# Patient Record
Sex: Female | Born: 1965 | Race: White | Hispanic: No | Smoking: Current every day smoker
Health system: Southern US, Community
[De-identification: ages and names within clinical notes are randomized; demographics above are authoritative.]

## PROBLEM LIST (undated history)

## (undated) ENCOUNTER — Inpatient Hospital Stay (HOSPITAL_COMMUNITY): Payer: Medicaid Other

## (undated) DIAGNOSIS — F319 Bipolar disorder, unspecified: Secondary | ICD-10-CM

## (undated) DIAGNOSIS — E119 Type 2 diabetes mellitus without complications: Secondary | ICD-10-CM

## (undated) DIAGNOSIS — I10 Essential (primary) hypertension: Secondary | ICD-10-CM

## (undated) DIAGNOSIS — J449 Chronic obstructive pulmonary disease, unspecified: Secondary | ICD-10-CM

## (undated) DIAGNOSIS — M1990 Unspecified osteoarthritis, unspecified site: Secondary | ICD-10-CM

## (undated) DIAGNOSIS — K219 Gastro-esophageal reflux disease without esophagitis: Secondary | ICD-10-CM

## (undated) HISTORY — PX: KNEE ARTHROSCOPY: SUR90

## (undated) HISTORY — PX: CHOLECYSTECTOMY: SHX55

## (undated) HISTORY — PX: OTHER SURGICAL HISTORY: SHX169

## (undated) HISTORY — PX: ABDOMINAL HYSTERECTOMY: SHX81

## (undated) HISTORY — PX: ELBOW SURGERY: SHX618

## (undated) HISTORY — PX: TUBAL LIGATION: SHX77

---

## 2010-05-30 ENCOUNTER — Emergency Department (HOSPITAL_BASED_OUTPATIENT_CLINIC_OR_DEPARTMENT_OTHER)
Admission: EM | Admit: 2010-05-30 | Discharge: 2010-05-31 | Disposition: A | Discharge status: Home / Self Care | Payer: Medicaid Other | Attending: Emergency Medicine | Admitting: Emergency Medicine

## 2010-05-30 ENCOUNTER — Emergency Department (INDEPENDENT_AMBULATORY_CARE_PROVIDER_SITE_OTHER): Payer: Medicaid Other

## 2010-05-30 DIAGNOSIS — R05 Cough: Secondary | ICD-10-CM

## 2010-05-30 DIAGNOSIS — R0602 Shortness of breath: Secondary | ICD-10-CM | POA: Insufficient documentation

## 2010-05-30 DIAGNOSIS — F17200 Nicotine dependence, unspecified, uncomplicated: Secondary | ICD-10-CM | POA: Insufficient documentation

## 2010-05-30 DIAGNOSIS — J40 Bronchitis, not specified as acute or chronic: Secondary | ICD-10-CM | POA: Insufficient documentation

## 2010-05-30 DIAGNOSIS — I10 Essential (primary) hypertension: Secondary | ICD-10-CM | POA: Insufficient documentation

## 2010-05-30 DIAGNOSIS — R059 Cough, unspecified: Secondary | ICD-10-CM | POA: Insufficient documentation

## 2010-06-14 ENCOUNTER — Emergency Department (INDEPENDENT_AMBULATORY_CARE_PROVIDER_SITE_OTHER): Payer: Medicaid Other

## 2010-06-14 ENCOUNTER — Emergency Department (HOSPITAL_BASED_OUTPATIENT_CLINIC_OR_DEPARTMENT_OTHER)
Admission: EM | Admit: 2010-06-14 | Discharge: 2010-06-14 | Disposition: A | Discharge status: Home / Self Care | Payer: Medicaid Other | Attending: Emergency Medicine | Admitting: Emergency Medicine

## 2010-06-14 DIAGNOSIS — R059 Cough, unspecified: Secondary | ICD-10-CM | POA: Insufficient documentation

## 2010-06-14 DIAGNOSIS — R05 Cough: Secondary | ICD-10-CM

## 2010-06-14 DIAGNOSIS — I10 Essential (primary) hypertension: Secondary | ICD-10-CM | POA: Insufficient documentation

## 2010-06-14 DIAGNOSIS — F1010 Alcohol abuse, uncomplicated: Secondary | ICD-10-CM | POA: Insufficient documentation

## 2010-06-14 DIAGNOSIS — R490 Dysphonia: Secondary | ICD-10-CM | POA: Insufficient documentation

## 2010-06-14 DIAGNOSIS — R093 Abnormal sputum: Secondary | ICD-10-CM | POA: Insufficient documentation

## 2010-06-14 DIAGNOSIS — J40 Bronchitis, not specified as acute or chronic: Secondary | ICD-10-CM | POA: Insufficient documentation

## 2010-06-14 DIAGNOSIS — Z79899 Other long term (current) drug therapy: Secondary | ICD-10-CM | POA: Insufficient documentation

## 2012-01-11 ENCOUNTER — Emergency Department (HOSPITAL_BASED_OUTPATIENT_CLINIC_OR_DEPARTMENT_OTHER): Payer: Medicaid Other

## 2012-01-11 ENCOUNTER — Encounter (HOSPITAL_BASED_OUTPATIENT_CLINIC_OR_DEPARTMENT_OTHER): Payer: Self-pay | Admitting: *Deleted

## 2012-01-11 ENCOUNTER — Emergency Department (HOSPITAL_BASED_OUTPATIENT_CLINIC_OR_DEPARTMENT_OTHER)
Admission: EM | Admit: 2012-01-11 | Discharge: 2012-01-11 | Disposition: A | Discharge status: Home / Self Care | Payer: Medicaid Other | Attending: Emergency Medicine | Admitting: Emergency Medicine

## 2012-01-11 DIAGNOSIS — F17200 Nicotine dependence, unspecified, uncomplicated: Secondary | ICD-10-CM | POA: Insufficient documentation

## 2012-01-11 DIAGNOSIS — Z79899 Other long term (current) drug therapy: Secondary | ICD-10-CM | POA: Insufficient documentation

## 2012-01-11 DIAGNOSIS — J40 Bronchitis, not specified as acute or chronic: Secondary | ICD-10-CM | POA: Insufficient documentation

## 2012-01-11 DIAGNOSIS — I10 Essential (primary) hypertension: Secondary | ICD-10-CM | POA: Insufficient documentation

## 2012-01-11 HISTORY — DX: Unspecified osteoarthritis, unspecified site: M19.90

## 2012-01-11 HISTORY — DX: Essential (primary) hypertension: I10

## 2012-01-11 MED ORDER — IPRATROPIUM BROMIDE 0.02 % IN SOLN
0.5000 mg | Freq: Once | RESPIRATORY_TRACT | Status: AC
  Administered 2012-01-11: 0.5 mg via RESPIRATORY_TRACT
  Filled 2012-01-11: qty 2.5

## 2012-01-11 MED ORDER — ALBUTEROL SULFATE (5 MG/ML) 0.5% IN NEBU
5.0000 mg | INHALATION_SOLUTION | Freq: Once | RESPIRATORY_TRACT | Status: AC
  Administered 2012-01-11: 5 mg via RESPIRATORY_TRACT
  Filled 2012-01-11: qty 1

## 2012-01-11 MED ORDER — ALBUTEROL SULFATE HFA 108 (90 BASE) MCG/ACT IN AERS: 1.0000 | INHALATION_SPRAY | Freq: Four times a day (QID) | RESPIRATORY_TRACT | Status: DC | PRN

## 2012-01-11 MED ORDER — AZITHROMYCIN 250 MG PO TABS: ORAL_TABLET | ORAL | Status: DC

## 2012-01-11 NOTE — ED Provider Notes (Signed)
History     CSN: [id number]  Arrival date & time 01/11/12  6578   First MD Initiated Contact with Patient 01/11/12 2102      Chief Complaint  Patient presents with  . Cough    (Consider location/radiation/quality/duration/timing/severity/associated sxs/prior treatment) Patient is a 46 y.o. female presenting with cough. The history is provided by the patient. No language interpreter was used.  Cough This is a new problem. Episode onset: 2 months. The problem occurs constantly. The problem has been gradually worsening. The cough is productive of sputum. There has been no fever. She has tried nothing for the symptoms. The treatment provided no relief. She is a smoker.  Pt reports she has not been taking her blood pressure medications  Past Medical History  Diagnosis Date  . Hypertension   . Arthritis     Past Surgical History  Procedure Date  . Elbow surgery     No family history on file.  History  Substance Use Topics  . Smoking status: Current Every Day Smoker -- 1.0 packs/day    Types: Cigarettes  . Smokeless tobacco: Not on file  . Alcohol Use: No    OB History    Grav Para Term Preterm Abortions TAB SAB Ect Mult Living                  Review of Systems  Respiratory: Positive for cough.   All other systems reviewed and are negative.    Allergies  Review of patient's allergies indicates no known allergies.  Home Medications   Current Outpatient Rx  Name Route Sig Dispense Refill  . ENALAPRIL MALEATE PO Oral Take by mouth.    Marland Kitchen HYDROCHLOROTHIAZIDE 12.5 MG PO CAPS Oral Take 12.5 mg by mouth daily.      BP 136/115  Pulse 68  Temp 98.3 F (36.8 C) (Oral)  Resp 16  SpO2 98%  Physical Exam  Nursing note and vitals reviewed. Constitutional: She is oriented to person, place, and time. She appears well-developed and well-nourished.  HENT:  Head: Normocephalic and atraumatic.  Right Ear: External ear normal.  Left Ear: External ear normal.  Nose:  Nose normal.  Mouth/Throat: Oropharynx is clear and moist.  Eyes: Conjunctivae normal are normal. Pupils are equal, round, and reactive to light.  Neck: Normal range of motion. Neck supple.  Cardiovascular: Normal rate and normal heart sounds.   Pulmonary/Chest: Effort normal and breath sounds normal.  Abdominal: Soft.  Musculoskeletal: Normal range of motion.  Neurological: She is oriented to person, place, and time.  Skin: Skin is warm.  Psychiatric: She has a normal mood and affect.    ED Course  Procedures (including critical care time)  Labs Reviewed - No data to display Dg Chest 2 View  01/11/2012  *RADIOLOGY REPORT*  Clinical Data: Productive cough.  Chest pain and congestion. Smoker.  CHEST - 2 VIEW  Comparison: 06/14/2010.  Findings: The heart remains normal in size and the lungs are clear. A small nodular density at the left lung base is slightly smaller than seen on 05/30/2010 and remains compatible with a small calcified granuloma.  This is in the lingula on the lateral view. Mild thoracic spine degenerative changes.  IMPRESSION: No acute abnormality.   Original Report Authenticated By: Darrol Angel, M.D.      1. Bronchitis   2. Hypertension       MDM  Pt feels better after albuterol neb,  I advised pt to take her blood pressure medications  and I will treat with zithromax and albuterol        Lonia Skinner [place], [place] 01/11/12 2217

## 2012-01-11 NOTE — ED Provider Notes (Signed)
Medical screening examination/treatment/procedure(s) were performed by non-physician practitioner and as supervising physician I was immediately available for consultation/collaboration.  Ethelda Chick, MD 01/11/12 2218

## 2012-01-11 NOTE — ED Notes (Signed)
Cough x 2 months. Coughs so hard it makes her vomit.

## 2012-02-07 ENCOUNTER — Emergency Department (HOSPITAL_BASED_OUTPATIENT_CLINIC_OR_DEPARTMENT_OTHER)
Admission: EM | Admit: 2012-02-07 | Discharge: 2012-02-07 | Disposition: A | Discharge status: Home / Self Care | Payer: Medicaid Other | Attending: Emergency Medicine | Admitting: Emergency Medicine

## 2012-02-07 ENCOUNTER — Encounter (HOSPITAL_BASED_OUTPATIENT_CLINIC_OR_DEPARTMENT_OTHER): Payer: Self-pay

## 2012-02-07 ENCOUNTER — Emergency Department (HOSPITAL_BASED_OUTPATIENT_CLINIC_OR_DEPARTMENT_OTHER): Payer: Medicaid Other

## 2012-02-07 DIAGNOSIS — F17200 Nicotine dependence, unspecified, uncomplicated: Secondary | ICD-10-CM | POA: Insufficient documentation

## 2012-02-07 DIAGNOSIS — Y9289 Other specified places as the place of occurrence of the external cause: Secondary | ICD-10-CM | POA: Insufficient documentation

## 2012-02-07 DIAGNOSIS — I10 Essential (primary) hypertension: Secondary | ICD-10-CM | POA: Insufficient documentation

## 2012-02-07 DIAGNOSIS — Z79899 Other long term (current) drug therapy: Secondary | ICD-10-CM | POA: Insufficient documentation

## 2012-02-07 DIAGNOSIS — X500XXA Overexertion from strenuous movement or load, initial encounter: Secondary | ICD-10-CM | POA: Insufficient documentation

## 2012-02-07 DIAGNOSIS — S99919A Unspecified injury of unspecified ankle, initial encounter: Secondary | ICD-10-CM | POA: Insufficient documentation

## 2012-02-07 DIAGNOSIS — M129 Arthropathy, unspecified: Secondary | ICD-10-CM | POA: Insufficient documentation

## 2012-02-07 DIAGNOSIS — Y9389 Activity, other specified: Secondary | ICD-10-CM | POA: Insufficient documentation

## 2012-02-07 DIAGNOSIS — S8990XA Unspecified injury of unspecified lower leg, initial encounter: Secondary | ICD-10-CM | POA: Insufficient documentation

## 2012-02-07 MED ORDER — TRAMADOL HCL 50 MG PO TABS
50.0000 mg | ORAL_TABLET | Freq: Once | ORAL | Status: AC
  Administered 2012-02-07: 50 mg via ORAL
  Filled 2012-02-07: qty 1

## 2012-02-07 MED ORDER — HYDROCODONE-ACETAMINOPHEN 5-500 MG PO TABS: 1.0000 | ORAL_TABLET | Freq: Four times a day (QID) | ORAL | Status: DC | PRN

## 2012-02-07 NOTE — ED Provider Notes (Signed)
History     CSN: [id number]  Arrival date & time 02/07/12  0040   First MD Initiated Contact with Patient 02/07/12 0114      Chief Complaint  Patient presents with  . Knee Pain    (Consider location/radiation/quality/duration/timing/severity/associated sxs/prior treatment) Patient is a 46 y.o. female presenting with knee pain. The history is provided by the patient. No language interpreter was used.  Knee Pain This is a new problem. The current episode started 2 days ago. The problem occurs constantly. The problem has been rapidly worsening. Pertinent negatives include no chest pain, no abdominal pain, no headaches and no shortness of breath. Nothing aggravates the symptoms. She has tried nothing for the symptoms. The treatment provided no relief.  Turned to get ketchup out of refrigerator and heard a pop in the left knee and has ongoing pain.    Past Medical History  Diagnosis Date  . Hypertension   . Arthritis     Past Surgical History  Procedure Date  . Elbow surgery     No family history on file.  History  Substance Use Topics  . Smoking status: Current Every Day Smoker -- 1.0 packs/day    Types: Cigarettes  . Smokeless tobacco: Not on file  . Alcohol Use: No    OB History    Grav Para Term Preterm Abortions TAB SAB Ect Mult Living                  Review of Systems  Respiratory: Negative for shortness of breath.   Cardiovascular: Negative for chest pain and leg swelling.  Gastrointestinal: Negative for abdominal pain.  Neurological: Negative for headaches.  All other systems reviewed and are negative.    Allergies  Review of patient's allergies indicates no known allergies.  Home Medications   Current Outpatient Rx  Name Route Sig Dispense Refill  . FLUTICASONE-SALMETEROL 230-21 MCG/ACT IN AERO Inhalation Inhale 2 puffs into the lungs 2 (two) times daily.    Marland Kitchen ZIPRASIDONE HCL 20 MG PO CAPS Oral Take 20 mg by mouth 2 (two) times daily with a meal.     . ALBUTEROL SULFATE HFA 108 (90 BASE) MCG/ACT IN AERS Inhalation Inhale 1-2 puffs into the lungs every 6 (six) hours as needed for wheezing. 1 Inhaler 0  . AZITHROMYCIN 250 MG PO TABS  2 tablets first day then 1 tablet days 2-5 6 tablet 0  . ENALAPRIL MALEATE PO Oral Take by mouth.    Marland Kitchen HYDROCHLOROTHIAZIDE 12.5 MG PO CAPS Oral Take 12.5 mg by mouth daily.      BP 126/84  Pulse 80  Temp 99 F (37.2 C) (Oral)  Resp 16  SpO2 100%  Physical Exam  Constitutional: She is oriented to person, place, and time. She appears well-developed and well-nourished. No distress.  HENT:  Head: Normocephalic and atraumatic.  Mouth/Throat: Oropharynx is clear and moist.  Eyes: Conjunctivae normal are normal. Pupils are equal, round, and reactive to light.  Neck: Normal range of motion. Neck supple.  Cardiovascular: Normal rate and regular rhythm.   Pulmonary/Chest: Effort normal and breath sounds normal. She has no wheezes.  Abdominal: Soft. Bowel sounds are normal.  Musculoskeletal: Normal range of motion. She exhibits no edema.       Negative anterior and posterior drawer test of the left knee.  No laxity to varus or valgus stress  Neurological: She is alert and oriented to person, place, and time. She has normal reflexes.  Skin: Skin is warm and  dry.  Psychiatric: She has a normal mood and affect.    ED Course  Procedures (including critical care time)  Labs Reviewed - No data to display Dg Knee Complete 4 Views Left  02/07/2012  *RADIOLOGY REPORT*  Clinical Data: Pain in the left knee after popping 2 days ago.  LEFT KNEE - COMPLETE 4+ VIEW  Comparison: None.  Findings: The left knee appears intact. No evidence of acute fracture or subluxation.  No focal bone lesions.  Bone matrix and cortex appear intact.  No abnormal radiopaque densities in the soft tissues.  No significant effusion.  IMPRESSION: No acute bony abnormalities.   Original Report Authenticated By: Marlon Pel, M.D.       No diagnosis found.    MDM  Will place in immobilizer and treat with pain meds if symptoms persist follow up with orthopedics for ongoing care.  Patient verbalizes understanding and agrees to follow up       Camya Haydon Smitty Cords, MD 02/07/12 [phone]

## 2012-02-07 NOTE — ED Notes (Signed)
Patient reports that she has been having left knee pain x 2 days. Reports that the pain started after she turned to but something in refrigerator. Pain with ambulation

## 2012-08-01 ENCOUNTER — Emergency Department (HOSPITAL_BASED_OUTPATIENT_CLINIC_OR_DEPARTMENT_OTHER): Payer: Medicaid Other

## 2012-08-01 ENCOUNTER — Emergency Department (HOSPITAL_BASED_OUTPATIENT_CLINIC_OR_DEPARTMENT_OTHER)
Admission: EM | Admit: 2012-08-01 | Discharge: 2012-08-01 | Disposition: A | Discharge status: Home / Self Care | Payer: Medicaid Other | Attending: Emergency Medicine | Admitting: Emergency Medicine

## 2012-08-01 ENCOUNTER — Encounter (HOSPITAL_BASED_OUTPATIENT_CLINIC_OR_DEPARTMENT_OTHER): Payer: Self-pay | Admitting: *Deleted

## 2012-08-01 DIAGNOSIS — S99922A Unspecified injury of left foot, initial encounter: Secondary | ICD-10-CM

## 2012-08-01 DIAGNOSIS — Y9389 Activity, other specified: Secondary | ICD-10-CM | POA: Insufficient documentation

## 2012-08-01 DIAGNOSIS — M79675 Pain in left toe(s): Secondary | ICD-10-CM

## 2012-08-01 DIAGNOSIS — S8990XA Unspecified injury of unspecified lower leg, initial encounter: Secondary | ICD-10-CM | POA: Insufficient documentation

## 2012-08-01 DIAGNOSIS — F17200 Nicotine dependence, unspecified, uncomplicated: Secondary | ICD-10-CM | POA: Insufficient documentation

## 2012-08-01 DIAGNOSIS — Z79899 Other long term (current) drug therapy: Secondary | ICD-10-CM | POA: Insufficient documentation

## 2012-08-01 DIAGNOSIS — Y92009 Unspecified place in unspecified non-institutional (private) residence as the place of occurrence of the external cause: Secondary | ICD-10-CM | POA: Insufficient documentation

## 2012-08-01 DIAGNOSIS — IMO0002 Reserved for concepts with insufficient information to code with codable children: Secondary | ICD-10-CM | POA: Insufficient documentation

## 2012-08-01 DIAGNOSIS — S99919A Unspecified injury of unspecified ankle, initial encounter: Secondary | ICD-10-CM | POA: Insufficient documentation

## 2012-08-01 DIAGNOSIS — M129 Arthropathy, unspecified: Secondary | ICD-10-CM | POA: Insufficient documentation

## 2012-08-01 DIAGNOSIS — I10 Essential (primary) hypertension: Secondary | ICD-10-CM | POA: Insufficient documentation

## 2012-08-01 DIAGNOSIS — X58XXXA Exposure to other specified factors, initial encounter: Secondary | ICD-10-CM | POA: Insufficient documentation
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
## 2012-08-02 NOTE — ED Provider Notes (Signed)
Medical screening examination/treatment/procedure(s) were performed by non-physician practitioner and as supervising physician I was immediately available for consultation/collaboration.   Glynn Octave, MD 08/02/12 [phone]

## 2017-06-06 ENCOUNTER — Other Ambulatory Visit: Payer: Self-pay

## 2017-06-06 ENCOUNTER — Encounter (HOSPITAL_BASED_OUTPATIENT_CLINIC_OR_DEPARTMENT_OTHER): Payer: Self-pay | Admitting: Emergency Medicine

## 2017-06-06 ENCOUNTER — Emergency Department (HOSPITAL_BASED_OUTPATIENT_CLINIC_OR_DEPARTMENT_OTHER): Payer: Medicaid Other

## 2017-06-06 ENCOUNTER — Emergency Department (HOSPITAL_BASED_OUTPATIENT_CLINIC_OR_DEPARTMENT_OTHER)
Admission: EM | Admit: 2017-06-06 | Discharge: 2017-06-06 | Disposition: A | Discharge status: Home / Self Care | Payer: Medicaid Other | Attending: Emergency Medicine | Admitting: Emergency Medicine

## 2017-06-06 DIAGNOSIS — J3489 Other specified disorders of nose and nasal sinuses: Secondary | ICD-10-CM | POA: Diagnosis not present

## 2017-06-06 DIAGNOSIS — I10 Essential (primary) hypertension: Secondary | ICD-10-CM | POA: Diagnosis not present

## 2017-06-06 DIAGNOSIS — R0602 Shortness of breath: Secondary | ICD-10-CM | POA: Diagnosis not present

## 2017-06-06 DIAGNOSIS — E119 Type 2 diabetes mellitus without complications: Secondary | ICD-10-CM | POA: Insufficient documentation

## 2017-06-06 DIAGNOSIS — R05 Cough: Secondary | ICD-10-CM | POA: Diagnosis not present

## 2017-06-06 DIAGNOSIS — R059 Cough, unspecified: Secondary | ICD-10-CM

## 2017-06-06 DIAGNOSIS — R1110 Vomiting, unspecified: Secondary | ICD-10-CM | POA: Insufficient documentation

## 2017-06-06 DIAGNOSIS — Z79899 Other long term (current) drug therapy: Secondary | ICD-10-CM | POA: Diagnosis not present

## 2017-06-06 DIAGNOSIS — R062 Wheezing: Secondary | ICD-10-CM | POA: Diagnosis not present

## 2017-06-06 DIAGNOSIS — J449 Chronic obstructive pulmonary disease, unspecified: Secondary | ICD-10-CM | POA: Diagnosis not present

## 2017-06-06 DIAGNOSIS — F17210 Nicotine dependence, cigarettes, uncomplicated: Secondary | ICD-10-CM | POA: Diagnosis not present

## 2017-06-06 HISTORY — DX: Bipolar disorder, unspecified: F31.9

## 2017-06-06 HISTORY — DX: Type 2 diabetes mellitus without complications: E11.9

## 2017-06-06 HISTORY — DX: Gastro-esophageal reflux disease without esophagitis: K21.9

## 2017-06-06 HISTORY — DX: Chronic obstructive pulmonary disease, unspecified: J44.9

## 2017-06-06 LAB — CBC WITH DIFFERENTIAL/PLATELET
BASOS PCT: 0 %
Basophils Absolute: 0 10*3/uL (ref 0.0–0.1)
EOS ABS: 0.1 10*3/uL (ref 0.0–0.7)
Eosinophils Relative: 2 %
HCT: 43.7 % (ref 36.0–46.0)
HEMOGLOBIN: 14.3 g/dL (ref 12.0–15.0)
Lymphocytes Relative: 23 %
Lymphs Abs: 2.2 10*3/uL (ref 0.7–4.0)
MCH: 30.4 pg (ref 26.0–34.0)
MCHC: 32.7 g/dL (ref 30.0–36.0)
MCV: 92.8 fL (ref 78.0–100.0)
MONO ABS: 0.9 10*3/uL (ref 0.1–1.0)
MONOS PCT: 10 %
NEUTROS PCT: 65 %
Neutro Abs: 6.4 10*3/uL (ref 1.7–7.7)
Platelets: 259 10*3/uL (ref 150–400)
RBC: 4.71 MIL/uL (ref 3.87–5.11)
RDW: 15 % (ref 11.5–15.5)
WBC: 9.6 10*3/uL (ref 4.0–10.5)

## 2017-06-06 LAB — BASIC METABOLIC PANEL
Anion gap: 10 (ref 5–15)
BUN: 13 mg/dL (ref 6–20)
CALCIUM: 9.2 mg/dL (ref 8.9–10.3)
CHLORIDE: 100 mmol/L — AB (ref 101–111)
CO2: 29 mmol/L (ref 22–32)
CREATININE: 0.67 mg/dL (ref 0.44–1.00)
GFR calc non Af Amer: >60 mL/min (ref >60)
GLUCOSE: 111 mg/dL — AB (ref 65–99)
Potassium: 3.1 mmol/L — ABNORMAL LOW (ref 3.5–5.1)
Sodium: 139 mmol/L (ref 135–145)

## 2017-06-06 LAB — TROPONIN I

## 2017-06-06 LAB — D-DIMER, QUANTITATIVE (NOT AT ARMC): D DIMER QUANT: 0.38 ug{FEU}/mL (ref 0.00–0.50)

## 2017-06-06 MED ORDER — PREDNISONE 50 MG PO TABS
60.0000 mg | ORAL_TABLET | Freq: Once | ORAL | Status: AC
  Administered 2017-06-06: 60 mg via ORAL
  Filled 2017-06-06: qty 1

## 2017-06-06 MED ORDER — ALBUTEROL SULFATE HFA 108 (90 BASE) MCG/ACT IN AERS: 1.0000 | INHALATION_SPRAY | Freq: Four times a day (QID) | RESPIRATORY_TRACT | 0 refills | Status: AC | PRN

## 2017-06-06 MED ORDER — POTASSIUM CHLORIDE CRYS ER 20 MEQ PO TBCR
40.0000 meq | EXTENDED_RELEASE_TABLET | Freq: Once | ORAL | Status: AC
Start: 2017-06-06 — End: 2017-06-06
  Administered 2017-06-06: 40 meq via ORAL
  Filled 2017-06-06: qty 2

## 2017-06-06 MED ORDER — ACETAMINOPHEN 325 MG PO TABS
650.0000 mg | ORAL_TABLET | Freq: Once | ORAL | Status: AC
  Administered 2017-06-06: 650 mg via ORAL
  Filled 2017-06-06: qty 2

## 2017-06-06 MED ORDER — BENZONATATE 100 MG PO CAPS: 100.0000 mg | ORAL_CAPSULE | Freq: Three times a day (TID) | ORAL | 0 refills | Status: DC

## 2017-06-06 MED ORDER — FENTANYL CITRATE (PF) 100 MCG/2ML IJ SOLN: 25.0000 ug | Freq: Once | INTRAMUSCULAR | Status: DC

## 2017-06-06 MED ORDER — PREDNISONE 20 MG PO TABS: 40.0000 mg | ORAL_TABLET | Freq: Every day | ORAL | 0 refills | Status: DC

## 2017-06-06 MED ORDER — IPRATROPIUM-ALBUTEROL 0.5-2.5 (3) MG/3ML IN SOLN
3.0000 mL | Freq: Once | RESPIRATORY_TRACT | Status: AC
  Administered 2017-06-06: 3 mL via RESPIRATORY_TRACT
  Filled 2017-06-06: qty 3

## 2017-06-06 NOTE — ED Triage Notes (Signed)
Cough for "months". States she has been on several medications without relief.
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
## 2017-06-06 NOTE — ED Notes (Signed)
Alert, NAD, calm, interactive, resps e/u, speaking in clear complete sentences, no dyspnea noted, skin W&D, VSS, c/o cough and HA, (denies: other pain, sob, nausea, dizziness or visual changes). [facility]

## 2017-06-06 NOTE — Discharge Instructions (Signed)
Your lab work has been reassuring.  Your symptoms seem consistent with a chronic bronchitis.  He may also be related to your lisinopril use.  Use the albuterol inhaler as needed for wheezing.  Take the prednisone starting tomorrow for the next 4 days.  Take the Tessalon for cough.  Continued use over-the-counter Mucinex.  Follow-up with your primary care doctor to see they want to discontinue lisinopril and change to a different blood pressure medication.  Return to the ED with any worsening symptoms.

## 2017-06-07 NOTE — ED Provider Notes (Signed)
[facility] EMERGENCY DEPARTMENT Provider Note   CSN: [id number] Arrival date & time: 06/06/17  1512     History   Chief Complaint Chief Complaint  Patient presents with  . Cough    HPI Audrey Woods is a 52 y.o. female.  HPI 52 year old Caucasian female past medical history significant for diabetes, hypertension, bipolar disorder, GERD presents to the emergency department today with complaints of 1 year of cough and runny nose.  Patient states that one year ago she was changed from losartan to lisinopril.  She states that since then she has been having a cough that is worse at night.  She also reports that she will get these significant coughing spells and will have significant amount of rhinorrhea and does have some episode of emesis at times.  She has been seen by her primary care doctor several times for same symptoms.  She was started on antibiotics several months ago for possible bronchitis.  States the symptoms persisted.  Patient reports of intermittent shortness of breath.  Denies any associated chest pain.  She does report some pain with inspiration.  She has not tried anything for her symptoms at this time.  Nothing makes better.  She does report a 2 pack a day smoking history.  Patient denies any history of DVT/PE, prolonged immobilization, recent hospitalization/surgeries, OCP use, hormone therapy, unilateral leg swelling or calf tenderness.  She denies any cardiac history is significant family cardiac history.  Pt denies any fever, chill, ha, vision changes, lightheadedness, dizziness, congestion, neck pain, cp, abd pain, urinary symptoms, change in bowel habits, melena, hematochezia, lower extremity paresthesias.  Past Medical History:  Diagnosis Date  . Arthritis   . Bipolar 1 disorder (HCC)   . COPD (chronic obstructive pulmonary disease) (HCC)   . Diabetes mellitus without complication (HCC)   . GERD (gastroesophageal reflux disease)   . Hypertension      There are no active problems to display for this patient.   Past Surgical History:  Procedure Laterality Date  . ELBOW SURGERY      OB History    No data available       Home Medications    Prior to Admission medications   Medication Sig Start Date End Date Taking? Authorizing Provider  diclofenac sodium (VOLTAREN) 1 % GEL Apply topically 4 (four) times daily.   Yes [provider]  gabapentin (NEURONTIN) 300 MG capsule Take 300 mg by mouth 3 (three) times daily.   Yes [provider]  lamoTRIgine (LAMICTAL) 100 MG tablet Take 100 mg by mouth daily.   Yes [provider]  lisinopril (PRINIVIL,ZESTRIL) 10 MG tablet Take 10 mg by mouth daily.   Yes [provider]  sertraline (ZOLOFT) 25 MG tablet Take 25 mg by mouth daily.   Yes [provider]  albuterol (PROVENTIL HFA;VENTOLIN HFA) 108 (90 Base) MCG/ACT inhaler Inhale 1-2 puffs into the lungs every 6 (six) hours as needed for wheezing or shortness of breath. 06/06/17   Rise Mu, PA-C  azithromycin (ZITHROMAX Z-PAK) 250 MG tablet 2 tablets first day then 1 tablet days 2-5 01/11/12   Elson Areas, PA-C  benzonatate (TESSALON) 100 MG capsule Take 1 capsule (100 mg total) by mouth every 8 (eight) hours. 06/06/17   Demetrios Loll T, PA-C  ENALAPRIL MALEATE PO Take by mouth.    [provider]  fluticasone-salmeterol (ADVAIR HFA) 230-21 MCG/ACT inhaler Inhale 2 puffs into the lungs 2 (two) times daily.  [provider]  hydrochlorothiazide (MICROZIDE) 12.5 MG capsule Take 12.5 mg by mouth daily.    [provider]  HYDROcodone-acetaminophen (VICODIN) 5-500 MG per tablet Take 1 tablet by mouth every 6 (six) hours as needed for pain. 02/07/12   Palumbo, April, MD  predniSONE (DELTASONE) 20 MG tablet Take 2 tablets (40 mg total) by mouth daily with breakfast. 06/06/17   Axtyn Woehler, Iantha Fallen T, PA-C  ramelteon (ROZEREM) 8 MG tablet Take 8 mg by mouth at  bedtime.    [provider]  ziprasidone (GEODON) 20 MG capsule Take 20 mg by mouth 2 (two) times daily with a meal.    [provider]    Family History No family history on file.  Social History Social History   Tobacco Use  . Smoking status: Current Every Day Smoker    Packs/day: 1.00    Types: Cigarettes  . Smokeless tobacco: Never Used  Substance Use Topics  . Alcohol use: No  . Drug use: No     Allergies   Patient has no known allergies.   Review of Systems Review of Systems  Constitutional: Negative for chills, diaphoresis and fever.  HENT: Positive for postnasal drip and rhinorrhea. Negative for congestion.   Eyes: Negative for visual disturbance.  Respiratory: Positive for cough, shortness of breath and wheezing.   Cardiovascular: Negative for chest pain, palpitations and leg swelling.  Gastrointestinal: Positive for vomiting. Negative for abdominal pain, diarrhea and nausea.  Genitourinary: Negative for dysuria, flank pain, frequency, hematuria and urgency.  Musculoskeletal: Negative for arthralgias and myalgias.  Skin: Negative for rash.  Neurological: Negative for dizziness, syncope, weakness, light-headedness, numbness and headaches.  Psychiatric/Behavioral: Negative for sleep disturbance. The patient is not nervous/anxious.      Physical Exam Updated Vital Signs BP 129/69   Pulse 78   Temp 99.2 F (37.3 C) (Oral)   Resp 20   Ht 5\' 3"  (1.6 m)   SpO2 94%   BMI 41.63 kg/m   Physical Exam  Constitutional: She is oriented to person, place, and time. She appears well-developed and well-nourished.  Non-toxic appearance. No distress.  HENT:  Head: Normocephalic and atraumatic.  Nose: Nose normal.  Mouth/Throat: Oropharynx is clear and moist.  Eyes: Conjunctivae are normal. Pupils are equal, round, and reactive to light. Right eye exhibits no discharge. Left eye exhibits no discharge.  Neck: Normal range of motion. Neck supple. No JVD  present. No tracheal deviation present.  Cardiovascular: Normal rate, regular rhythm, normal heart sounds and intact distal pulses. Exam reveals no gallop and no friction rub.  No murmur heard. Pulmonary/Chest: Effort normal. No stridor. No respiratory distress. She has wheezes (expiratory). She has no rales. She exhibits no tenderness.  No hypoxia or tachypnea.  Abdominal: Soft. Bowel sounds are normal. She exhibits no distension. There is no tenderness. There is no rebound and no guarding.  Musculoskeletal: Normal range of motion.  No lower extremity edema or calf tenderness.  Lymphadenopathy:    She has no cervical adenopathy.  Neurological: She is alert and oriented to person, place, and time.  Skin: Skin is warm and dry. Capillary refill takes less than 2 seconds. She is not diaphoretic.  Psychiatric: Her behavior is normal. Judgment and thought content normal.  Nursing note and vitals reviewed.    ED Treatments / Results  Labs (all labs ordered are listed, but only abnormal results are displayed) Labs Reviewed  BASIC METABOLIC PANEL - Abnormal; Notable for the following components:  Result Value   Potassium 3.1 (*)    Chloride 100 (*)    Glucose, Bld 111 (*)    All other components within normal limits  CBC WITH DIFFERENTIAL/PLATELET  TROPONIN I  D-DIMER, QUANTITATIVE (NOT AT [facility])    EKG  EKG Interpretation  Date/Time:  Sunday June 06 2017 20:19:18 EST Ventricular Rate:  76 PR Interval:    QRS Duration: 85 QT Interval:  386 QTC Calculation: 434 R Axis:   65 Text Interpretation:  Sinus rhythm Borderline T abnormalities, anterior leads Poor R wave progression new from prior 2/12 Confirmed by Meridee ScoreButler, Michael [phone]) on 06/06/2017 10:15:47 PM Also confirmed by Meridee ScoreButler, Michael [phone]), editor Elita QuickWatlington, Beverly ([id number])  on 06/07/2017 7:10:55 AM       Radiology Dg Chest 2 View  Result Date: 06/06/2017 CLINICAL DATA:  Cough, fever, and vomiting. EXAM: CHEST  2  VIEW COMPARISON:  01/11/2012 FINDINGS: The heart size and mediastinal contours are within normal limits. Both lungs are clear. The visualized skeletal structures are unremarkable. IMPRESSION: No active cardiopulmonary disease. Electronically Signed   By: Myles RosenthalJohn  Stahl M.D.   On: 06/06/2017 19:19    Procedures Procedures (including critical care time)  Medications Ordered in ED Medications  predniSONE (DELTASONE) tablet 60 mg (60 mg Oral Given 06/06/17 1952)  ipratropium-albuterol (DUONEB) 0.5-2.5 (3) MG/3ML nebulizer solution 3 mL (3 mLs Nebulization Given 06/06/17 1951)  acetaminophen (TYLENOL) tablet 650 mg (650 mg Oral Given 06/06/17 2200)  potassium chloride SA (K-DUR,KLOR-CON) CR tablet 40 mEq (40 mEq Oral Given 06/06/17 2238)     Initial Impression / Assessment and Plan / ED Course  I have reviewed the triage vital signs and the nursing notes.  Pertinent labs & imaging results that were available during my care of the patient were reviewed by me and considered in my medical decision making (see chart for details).     She presents to the ED with a one year history of intermittent cough, nasal congestion and posttussive emesis.  Patient has been seen by her primary care doctor for same several times without any known cause.  Patient does report 2 pack a day smoking history.  Denies any associated fevers, chest pain, abdominal pain.  Overall well-appearing and nontoxic.  Vital signs are reassuring.  She is afebrile, no tachycardia, no hypotension, no tachypnea or hypoxia noted.  Lungs with mild expiratory wheezes noted.  No rhonchi noted.  She does have a productive cough.  No lower extremity edema.  Heart regular rate and rhythm.  No focal abdominal tenderness.  Lab work has been very reassuring in the ED.  No leukocytosis.  Mild hypokalemia 3.1 which was replaced with oral potassium.  Troponin was negative.  EKG shows no ischemic changes.  Does note poor R wave progression that is new  from prior.  D-dimer was negative.  Otherwise elect lites are reassuring.  Chest x-ray shows no focal findings.  Patient symptoms seem consistent with likely a chronic bronchitis.  However given symptoms started lisinopril was initiated her cough could be from the lisinopril.  Instructed patient to discuss with her primary care doctor if they should change her blood pressure medication to see if this helps her cough.  She was treated with a DuoNeb in the ED and has had significant improvement in her cough and wheezing.  Repeat lung exam was unremarkable.  Patient will be given steroids, inhaler and cough medication.  Discussed follow-up with PCP.  No signs of pneumonia or indication for antibiotics at  this time.  Given the negative d-dimer low suspicion for PE.  Very atypical for ACS, dissection.  Patient able to ambulate with oxygen saturations 95%.  This is likely baseline for patient given her 2 pack a day smoking history.  Pt is hemodynamically stable, in NAD, & able to ambulate in the ED. Evaluation does not show pathology that would require ongoing emergent intervention or inpatient treatment. I explained the diagnosis to the patient. Pain has been managed & has no complaints prior to dc. Pt is comfortable with above plan and is stable for discharge at this time. All questions were answered prior to disposition. Strict return precautions for f/u to the ED were discussed. Encouraged follow up with PCP.    Final Clinical Impressions(s) / ED Diagnoses   Final diagnoses:  Cough  Wheezing       Wallace Keller 06/07/17 1602    Terrilee Files, MD 06/07/17 [phone]

## 2017-11-04 ENCOUNTER — Emergency Department (HOSPITAL_BASED_OUTPATIENT_CLINIC_OR_DEPARTMENT_OTHER)
Admission: EM | Admit: 2017-11-04 | Discharge: 2017-11-04 | Disposition: A | Discharge status: Home / Self Care | Payer: Medicaid Other | Attending: Emergency Medicine | Admitting: Emergency Medicine

## 2017-11-04 ENCOUNTER — Emergency Department (HOSPITAL_BASED_OUTPATIENT_CLINIC_OR_DEPARTMENT_OTHER): Payer: Medicaid Other

## 2017-11-04 ENCOUNTER — Other Ambulatory Visit: Payer: Self-pay

## 2017-11-04 ENCOUNTER — Encounter (HOSPITAL_BASED_OUTPATIENT_CLINIC_OR_DEPARTMENT_OTHER): Payer: Self-pay | Admitting: Emergency Medicine

## 2017-11-04 DIAGNOSIS — M25561 Pain in right knee: Secondary | ICD-10-CM

## 2017-11-04 DIAGNOSIS — Y929 Unspecified place or not applicable: Secondary | ICD-10-CM | POA: Insufficient documentation

## 2017-11-04 DIAGNOSIS — J449 Chronic obstructive pulmonary disease, unspecified: Secondary | ICD-10-CM | POA: Diagnosis not present

## 2017-11-04 DIAGNOSIS — W19XXXA Unspecified fall, initial encounter: Secondary | ICD-10-CM | POA: Insufficient documentation

## 2017-11-04 DIAGNOSIS — Y939 Activity, unspecified: Secondary | ICD-10-CM | POA: Diagnosis not present

## 2017-11-04 DIAGNOSIS — I10 Essential (primary) hypertension: Secondary | ICD-10-CM | POA: Diagnosis not present

## 2017-11-04 DIAGNOSIS — Z79899 Other long term (current) drug therapy: Secondary | ICD-10-CM | POA: Diagnosis not present

## 2017-11-04 DIAGNOSIS — E119 Type 2 diabetes mellitus without complications: Secondary | ICD-10-CM | POA: Insufficient documentation

## 2017-11-04 DIAGNOSIS — F17210 Nicotine dependence, cigarettes, uncomplicated: Secondary | ICD-10-CM | POA: Diagnosis not present

## 2017-11-04 DIAGNOSIS — Y990 Civilian activity done for income or pay: Secondary | ICD-10-CM | POA: Insufficient documentation

## 2017-11-04 DIAGNOSIS — Z7982 Long term (current) use of aspirin: Secondary | ICD-10-CM | POA: Insufficient documentation

## 2017-11-04 MED ORDER — HYDROCODONE-ACETAMINOPHEN 5-325 MG PO TABS
1.0000 | ORAL_TABLET | Freq: Once | ORAL | Status: AC
  Administered 2017-11-04: 1 via ORAL
  Filled 2017-11-04: qty 1

## 2017-11-04 MED ORDER — NAPROXEN 500 MG PO TABS: 500.0000 mg | ORAL_TABLET | Freq: Two times a day (BID) | ORAL | 0 refills | Status: AC

## 2017-11-04 NOTE — ED Provider Notes (Signed)
[facility] EMERGENCY DEPARTMENT Provider Note   CSN: [id number] Arrival date & time: 11/04/17  1909     History   Chief Complaint Chief Complaint  Patient presents with  . Fall  . Knee Injury    HPI Audrey Woods is a 52 y.o. female presenting for evaluation of right knee pain after a fall.  Patient states that she was at work when she had a mechanical fall, landing on the anterior aspect of her right knee.  She reports acute onset of right knee pain.  Pain continued to worsen throughout the day, and after she remained seated for an extended period of time, she reports inability to extend or walk on her right knee due to pain.  She denies radiation of the pain.  It is of the anterior aspect, does not radiate down her leg or up into her hip.  She denies injury elsewhere.  She is not on blood thinners.  She has not taken anything for pain including Tylenol or ibuprofen.  She denies numbness or tingling in her legs.  She has a history of arthritis, gets steroid injections every 3 months with orthopedics.  HPI  Past Medical History:  Diagnosis Date  . Arthritis   . Bipolar 1 disorder (HCC)   . COPD (chronic obstructive pulmonary disease) (HCC)   . Diabetes mellitus without complication (HCC)   . GERD (gastroesophageal reflux disease)   . Hypertension     There are no active problems to display for this patient.   Past Surgical History:  Procedure Laterality Date  . ABDOMINAL HYSTERECTOMY    . CHOLECYSTECTOMY    . ELBOW SURGERY    . KNEE ARTHROSCOPY    . TUBAL LIGATION    . ulnar nerve entrapment        OB History   None      Home Medications    Prior to Admission medications   Medication Sig Start Date End Date Taking? Authorizing Provider  albuterol (PROVENTIL HFA;VENTOLIN HFA) 108 (90 Base) MCG/ACT inhaler Inhale 1-2 puffs into the lungs every 6 (six) hours as needed for wheezing or shortness of breath. 06/06/17  Yes Rise MuLeaphart, Kenneth T, PA-C  aspirin  EC 81 MG tablet Take 81 mg by mouth daily.   Yes [provider]  diclofenac sodium (VOLTAREN) 1 % GEL Apply topically 4 (four) times daily.   Yes [provider]  fluticasone-salmeterol (ADVAIR HFA) 230-21 MCG/ACT inhaler Inhale 2 puffs into the lungs 2 (two) times daily.   Yes [provider]  gabapentin (NEURONTIN) 300 MG capsule Take 300 mg by mouth 3 (three) times daily.   Yes [provider]  hydrochlorothiazide (MICROZIDE) 12.5 MG capsule Take 12.5 mg by mouth daily.   Yes [provider]  lamoTRIgine (LAMICTAL) 100 MG tablet Take 100 mg by mouth daily.   Yes [provider]  montelukast (SINGULAIR) 10 MG tablet Take 10 mg by mouth daily before supper.   Yes [provider]  omeprazole (PRILOSEC) 20 MG capsule Take 20 mg by mouth daily.   Yes [provider]  sertraline (ZOLOFT) 25 MG tablet Take 25 mg by mouth daily.   Yes [provider]  azithromycin (ZITHROMAX Z-PAK) 250 MG tablet 2 tablets first day then 1 tablet days 2-5 01/11/12   Elson AreasSofia, Leslie K, PA-C  benzonatate (TESSALON) 100 MG capsule Take 1 capsule (100 mg total) by mouth every 8 (eight) hours. 06/06/17   Demetrios LollLeaphart, Kenneth T, PA-C  ENALAPRIL MALEATE  PO Take by mouth.    [provider]  HYDROcodone-acetaminophen (VICODIN) 5-500 MG per tablet Take 1 tablet by mouth every 6 (six) hours as needed for pain. 02/07/12   Palumbo, April, MD  lisinopril (PRINIVIL,ZESTRIL) 10 MG tablet Take 10 mg by mouth daily.    [provider]  naproxen (NAPROSYN) 500 MG tablet Take 1 tablet (500 mg total) by mouth 2 (two) times daily with a meal. 11/04/17   Narcissus Detwiler, PA-C  predniSONE (DELTASONE) 20 MG tablet Take 2 tablets (40 mg total) by mouth daily with breakfast. 06/06/17   Leaphart, Iantha Fallen T, PA-C  ramelteon (ROZEREM) 8 MG tablet Take 8 mg by mouth at bedtime.    [provider]  ziprasidone (GEODON) 20 MG capsule Take 20 mg by mouth  2 (two) times daily with a meal.    [provider]    Family History Family History  Problem Relation Age of Onset  . Cancer Father   . Cancer Sister   . Cancer Brother   . Cancer Maternal Grandmother   . Cancer Paternal Grandmother     Social History Social History   Tobacco Use  . Smoking status: Current Every Day Smoker    Packs/day: 1.00    Types: Cigarettes  . Smokeless tobacco: Never Used  Substance Use Topics  . Alcohol use: No  . Drug use: No     Allergies   Patient has no known allergies.   Review of Systems Review of Systems  Musculoskeletal: Positive for arthralgias and joint swelling.  Neurological: Negative for numbness.     Physical Exam Updated Vital Signs BP (!) 144/76   Pulse 60   Temp 98.5 F (36.9 C) (Oral)   Resp 18   Ht 5\' 3"  (1.6 m)   Wt 107.5 kg (237 lb)   SpO2 96%   BMI 41.98 kg/m   Physical Exam  Constitutional: She is oriented to person, place, and time. She appears well-developed and well-nourished. No distress.  HENT:  Head: Normocephalic and atraumatic.  Eyes: EOM are normal.  Neck: Normal range of motion.  Pulmonary/Chest: Effort normal.  Abdominal: She exhibits no distension.  Musculoskeletal: She exhibits edema and tenderness.  Tenderness palpation of the anterior aspect of the right knee.  Minimal swelling.  No obvious deformity.  Pedal pulses intact bilaterally.  Sensation of lower extremities intact bilaterally.  Pt unwilling to let me range due to pain.  No erythema or warmth.  Neurological: She is alert and oriented to person, place, and time. No sensory deficit.  Skin: Skin is warm. Capillary refill takes less than 2 seconds. No rash noted.  Psychiatric: She has a normal mood and affect.  Nursing note and vitals reviewed.    ED Treatments / Results  Labs (all labs ordered are listed, but only abnormal results are displayed) Labs Reviewed - No data to display  EKG None  Radiology Ct Knee Right  Wo Contrast  Result Date: 11/04/2017 CLINICAL DATA:  Fall on concrete.  Possible fracture. EXAM: CT OF THE RIGHT KNEE WITHOUT CONTRAST TECHNIQUE: Multidetector CT imaging of the RIGHT knee was performed according to the standard protocol. Multiplanar CT image reconstructions were also generated. COMPARISON:  RIGHT knee radiographs November 04, 2017 and MRI of the RIGHT knee April 30, 2017 FINDINGS: Bones/Joint/Cartilage No fracture or dislocation. No destructive bony lesions. 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 3 mm [employer] medial femoral condyle.  Ligaments Suboptimally assessed by CT. Muscles and Tendons Normal appearance though not tailored for evaluation. Soft tissues Moderate suprapatellar joint effusion. Chronic interstitial edema infrapatellar soft tissues. No loose bodies. Mild nonspecific fat stranding popliteal fossa associated with cystic changes on prior MRI. IMPRESSION: 1. No fracture or dislocation. 2. Moderate suprapatellar joint effusion. 3. 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 Electronically Signed   By: Awilda Metro M.D.   On: 11/04/2017 22:52   Dg Knee Complete 4 Views Right  Result Date: 11/04/2017 CLINICAL DATA:  Right knee pain after fall today. EXAM: RIGHT KNEE - COMPLETE 4+ VIEW COMPARISON:  Radiographs of May 12, 2016 FINDINGS: No evidence of fracture, dislocation, or joint effusion. Severe narrowing of medial joint space is noted. Soft tissues are unremarkable. IMPRESSION: Severe degenerative joint disease is noted medially. No acute abnormality seen in the right knee. Electronically Signed   By: Lupita Raider, M.D.   On: 11/04/2017 20:25    Procedures Procedures (including critical care time)  Medications Ordered in ED Medications  HYDROcodone-acetaminophen (NORCO/VICODIN) 5-325 MG per tablet 1 tablet (1 tablet Oral Given  11/04/17 2115)     Initial Impression / Assessment and Plan / ED Course  I have reviewed the triage vital signs and the nursing notes.  Pertinent labs & imaging results that were available during my care of the patient were reviewed by me and considered in my medical decision making (see chart for details).     Pt presenting for evaluation of right knee pain after fall.  Physical exam reassuring, she is neurovascularly intact.  X-ray viewed and interpreted by me, no fractures or dislocations.  However, patient with significant pain, unwilling to let me range due to pain.  Will obtain CT to rule out tibial plateau fracture or other occult fracture.  Norco given for pain while waiting.  CT negative for fracture.  Shows severe arthritis.  Discussed findings with patient.  Discussed treatment with NSAIDs, compression, and follow-up with orthopedics.  Patient offered crutches for symptom control.  At this time, patient appears safe for discharge.  Return precautions given.  Patient states she understands and agrees plan  Final Clinical Impressions(s) / ED Diagnoses   Final diagnoses:  Acute pain of right knee    ED Discharge Orders        Ordered    naproxen (NAPROSYN) 500 MG tablet  2 times daily with meals     11/04/17 2308       Alveria Apley, PA-C 11/05/17 0038    Tegeler, Canary Brim, MD 11/05/17 [phone]

## 2017-11-04 NOTE — ED Triage Notes (Signed)
Pt states she was at work this morning and fell landing on her right knee on the concrete floor  Pt states she continued to work  Pt states this evening she is unable to bear weight on her right leg

## 2017-11-04 NOTE — Discharge Instructions (Addendum)
Take naproxen 2 times a day with meals.  Do not take other anti-inflammatories at the same time open (Advil, Motrin, ibuprofen, Aleve). You may supplement with Tylenol if you need further pain control. Use ice packs, 20 minutes at a time, 3 times a day.  Use the knee sleeve to help with pain and swelling. Use crutches as needed for symptomatic control. Follow-up with orthopedic doctor for further evaluation of your knee. Return to the emergency room with any new, worsening, or concerning symptoms.

## 2018-07-01 ENCOUNTER — Telehealth: Payer: Medicaid Other | Admitting: Nurse Practitioner

## 2018-07-01 DIAGNOSIS — R059 Cough, unspecified: Secondary | ICD-10-CM

## 2018-07-01 DIAGNOSIS — R05 Cough: Secondary | ICD-10-CM

## 2018-07-01 MED ORDER — BENZONATATE 100 MG PO CAPS: 100.0000 mg | ORAL_CAPSULE | Freq: Three times a day (TID) | ORAL | 0 refills | Status: DC | PRN

## 2018-07-01 NOTE — Progress Notes (Signed)
E-Visit for Corona Virus Screening  Based on your current symptoms, it seems unlikely that your symptoms are related to the Coronavirus.   Coronavirus disease 2019 (COVID-19) is a respiratory illness that can spread from person to person. The virus that causes COVID-19 is a new virus that was first identified in the country of [place] but is now found in multiple other countries and has spread to the [place].  Symptoms associated with the virus are mild to severe fever, cough, and shortness of breath. There is currently no vaccine to protect against COVID-19, and there is no specific antiviral treatment for the virus.   To be considered HIGH RISK for Coronavirus (COVID-19), you have to meet the following criteria:  . Traveled to [place], [place], [place], [place] or [place]; or in the [place] to [place], [place], [place], or [place]; and have fever, cough, and shortness of breath within the last 2 weeks of travel OR  . Been in close contact with a person diagnosed with COVID-19 within the last 2 weeks and have fever, cough, and shortness of breath  . IF YOU DO NOT MEET THESE CRITERIA, YOU ARE CONSIDERED LOW RISK FOR COVID-19.   It is vitally important that if you feel that you have an infection such as this virus or any other virus that you stay home and away from places where you may spread it to others.  You should self-quarantine for 14 days if you have symptoms that could potentially be coronavirus and avoid contact with people age 65 and older.   You can use medication such as A prescription cough medication called Tessalon Perles 100 mg. You may take 1-2 capsules every 8 hours as needed for cough  You may also take acetaminophen (Tylenol) as needed for fever.   Reduce your risk of any infection by using the same precautions used for avoiding the common cold or flu:  . Wash your hands often with soap and warm water for at least 20 seconds.  If soap and water are not readily  available, use an alcohol-based hand sanitizer with at least 60% alcohol.  . If coughing or sneezing, cover your mouth and nose by coughing or sneezing into the elbow areas of your shirt or coat, into a tissue or into your sleeve (not your hands). . Avoid shaking hands with others and consider head nods or verbal greetings only. . Avoid touching your eyes, nose, or mouth with unwashed hands.  . Avoid close contact with people who are sick. . Avoid places or events with large numbers of people in one location, like concerts or sporting events. . Carefully consider travel plans you have or are making. . If you are planning any travel outside or inside the [place], visit the CDC's Travelers' Health webpage for the latest health notices. . If you have some symptoms but not all symptoms, continue to monitor at home and seek medical attention if your symptoms worsen. . If you are having a medical emergency, call 911.  HOME CARE . Only take medications as instructed by your medical team. . Drink plenty of fluids and get plenty of rest. . A steam or ultrasonic humidifier can help if you have congestion.   GET HELP RIGHT AWAY IF: . You develop worsening fever. . You become short of breath . You cough up blood. . Your symptoms become more severe MAKE SURE YOU   Understand these instructions.  Will watch your condition.  Will get help   right away if you are not doing well or get worse.  Your e-visit answers were reviewed by a board certified advanced clinical practitioner to complete your personal care plan.  Depending on the condition, your plan could have included both over the counter or prescription medications.  If there is a problem please reply once you have received a response from your provider. Your safety is important to us.  If you have drug allergies check your prescription carefully.    You can use MyChart to ask questions about today's visit, request a non-urgent call back, or ask for a  work or school excuse for 24 hours related to this e-Visit. If it has been greater than 24 hours you will need to follow up with your provider, or enter a new e-Visit to address those concerns. You will get an e-mail in the next two days asking about your experience.  I hope that your e-visit has been valuable and will speed your recovery. Thank you for using e-visits.   5 minutes spent reviewing and documenting in chart.  

## 2018-07-06 ENCOUNTER — Telehealth: Payer: Medicaid Other | Admitting: Family

## 2018-07-06 DIAGNOSIS — R05 Cough: Secondary | ICD-10-CM

## 2018-07-06 DIAGNOSIS — R059 Cough, unspecified: Secondary | ICD-10-CM

## 2018-07-06 DIAGNOSIS — Z7189 Other specified counseling: Secondary | ICD-10-CM

## 2018-07-06 NOTE — Progress Notes (Signed)
E-Visit for Corona Virus Screening  Based on your current symptoms, you may very well have the virus, however your symptoms are mild. Currently, not all patients are being tested. If the symptoms are mild and there is not a known exposure, performing the test is not indicated.   I have sent your work note to your MyChart.  Coronavirus disease 2019 (COVID-19) is a respiratory illness that can spread from person to person. The virus that causes COVID-19 is a new virus that was first identified in the country of [place] but is now found in multiple other countries and has spread to the [place].  Symptoms associated with the virus are mild to severe fever, cough, and shortness of breath. There is currently no vaccine to protect against COVID-19, and there is no specific antiviral treatment for the virus.   To be considered HIGH RISK for Coronavirus (COVID-19), you have to meet the following criteria:  . Traveled to [place], [place], [place], [place] or [place]; or in the [place] to [place], [place], [place], or [place]; and have fever, cough, and shortness of breath within the last 2 weeks of travel OR  . Been in close contact with a person diagnosed with COVID-19 within the last 2 weeks and have fever, cough, and shortness of breath  . IF YOU DO NOT MEET THESE CRITERIA, YOU ARE CONSIDERED LOW RISK FOR COVID-19.   It is vitally important that if you feel that you have an infection such as this virus or any other virus that you stay home and away from places where you may spread it to others.  You should self-quarantine for 14 days if you have symptoms that could potentially be coronavirus and avoid contact with people age 26 and older.    You may also take acetaminophen (Tylenol) as needed for fever.   Reduce your risk of any infection by using the same precautions used for avoiding the common cold or flu:  Marland Kitchen Wash your hands often with soap and warm water for at least 20 seconds.  If  soap and water are not readily available, use an alcohol-based hand sanitizer with at least 60% alcohol.  . If coughing or sneezing, cover your mouth and nose by coughing or sneezing into the elbow areas of your shirt or coat, into a tissue or into your sleeve (not your hands). . Avoid shaking hands with others and consider head nods or verbal greetings only. . Avoid touching your eyes, nose, or mouth with unwashed hands.  . Avoid close contact with people who are sick. . Avoid places or events with large numbers of people in one location, like concerts or sporting events. . Carefully consider travel plans you have or are making. . If you are planning any travel outside or inside the [place], visit the CDC's Travelers' Health webpage for the latest health notices. . If you have some symptoms but not all symptoms, continue to monitor at home and seek medical attention if your symptoms worsen. . If you are having a medical emergency, call 911.  HOME CARE . Only take medications as instructed by your medical team. . Drink plenty of fluids and get plenty of rest. . A steam or ultrasonic humidifier can help if you have congestion.   GET HELP RIGHT AWAY IF: . You develop worsening fever. . You become short of breath . You cough up blood. . Your symptoms become more severe MAKE SURE YOU   Understand these instructions.  Will watch your condition.  Will get help right away if you are not doing well or get worse.  Your e-visit answers were reviewed by a board certified advanced clinical practitioner to complete your personal care plan.  Depending on the condition, your plan could have included both over the counter or prescription medications.  If there is a problem please reply once you have received a response from your provider. Your safety is important to [place].  If you have drug allergies check your prescription carefully.    You can use MyChart to ask questions about today's visit, request a  non-urgent call back, or ask for a work or school excuse for 24 hours related to this e-Visit. If it has been greater than 24 hours you will need to follow up with your provider, or enter a new e-Visit to address those concerns. You will get an e-mail in the next two days asking about your experience.  I hope that your e-visit has been valuable and will speed your recovery. Thank you for using e-visits.

## 2019-03-19 ENCOUNTER — Encounter (HOSPITAL_BASED_OUTPATIENT_CLINIC_OR_DEPARTMENT_OTHER): Payer: Self-pay | Admitting: Emergency Medicine

## 2019-03-19 ENCOUNTER — Other Ambulatory Visit: Payer: Self-pay

## 2019-03-19 ENCOUNTER — Emergency Department (HOSPITAL_BASED_OUTPATIENT_CLINIC_OR_DEPARTMENT_OTHER)
Admission: EM | Admit: 2019-03-19 | Discharge: 2019-03-19 | Disposition: A | Discharge status: Home / Self Care | Payer: Medicaid Other | Attending: Emergency Medicine | Admitting: Emergency Medicine

## 2019-03-19 ENCOUNTER — Emergency Department (HOSPITAL_BASED_OUTPATIENT_CLINIC_OR_DEPARTMENT_OTHER): Payer: Medicaid Other

## 2019-03-19 DIAGNOSIS — Y999 Unspecified external cause status: Secondary | ICD-10-CM | POA: Diagnosis not present

## 2019-03-19 DIAGNOSIS — Y9289 Other specified places as the place of occurrence of the external cause: Secondary | ICD-10-CM | POA: Diagnosis not present

## 2019-03-19 DIAGNOSIS — Y9301 Activity, walking, marching and hiking: Secondary | ICD-10-CM | POA: Insufficient documentation

## 2019-03-19 DIAGNOSIS — I10 Essential (primary) hypertension: Secondary | ICD-10-CM | POA: Insufficient documentation

## 2019-03-19 DIAGNOSIS — E119 Type 2 diabetes mellitus without complications: Secondary | ICD-10-CM | POA: Diagnosis not present

## 2019-03-19 DIAGNOSIS — S43491A Other sprain of right shoulder joint, initial encounter: Secondary | ICD-10-CM | POA: Diagnosis not present

## 2019-03-19 DIAGNOSIS — F17210 Nicotine dependence, cigarettes, uncomplicated: Secondary | ICD-10-CM | POA: Diagnosis not present

## 2019-03-19 DIAGNOSIS — Z79899 Other long term (current) drug therapy: Secondary | ICD-10-CM | POA: Insufficient documentation

## 2019-03-19 DIAGNOSIS — W01198A Fall on same level from slipping, tripping and stumbling with subsequent striking against other object, initial encounter: Secondary | ICD-10-CM | POA: Diagnosis not present

## 2019-03-19 DIAGNOSIS — Z7982 Long term (current) use of aspirin: Secondary | ICD-10-CM | POA: Diagnosis not present

## 2019-03-19 DIAGNOSIS — J449 Chronic obstructive pulmonary disease, unspecified: Secondary | ICD-10-CM | POA: Insufficient documentation

## 2019-03-19 DIAGNOSIS — S43401A Unspecified sprain of right shoulder joint, initial encounter: Secondary | ICD-10-CM

## 2019-03-19 DIAGNOSIS — S4991XA Unspecified injury of right shoulder and upper arm, initial encounter: Secondary | ICD-10-CM | POA: Diagnosis present

## 2019-03-19 MED ORDER — HYDROCODONE-ACETAMINOPHEN 5-325 MG PO TABS
1.0000 | ORAL_TABLET | ORAL | Status: AC
  Administered 2019-03-19: 1 via ORAL
  Filled 2019-03-19: qty 1

## 2019-03-19 NOTE — ED Triage Notes (Signed)
Pt reports fall 0530 today. Pt reports that her knee shifted causing her to fall. Pt used her right arm to brace her fall.

## 2019-03-19 NOTE — Discharge Instructions (Signed)
Apply ice to help with swelling and discomfort, continue to take over-the-counter medications as needed, follow-up with your primary care, orthopedic, or sports medicine doctor if not improving in the next week

## 2019-03-19 NOTE — ED Provider Notes (Signed)
[facility] EMERGENCY DEPARTMENT Provider Note   CSN: [id number] Arrival date & time: 03/19/19  0820     History   Chief Complaint Chief Complaint  Patient presents with  . Fall  . Arm Pain  . Shoulder Pain    HPI Audrey Woods is a 53 y.o. female.     HPI Patient presents to the ED for evaluation of right upper arm injury.  Patient states she has history of chronic knee issues.  Sometimes they feel like they give way on her.  She was walking this morning when she felt her knee shift and she started to lose balance.  She tried to reach out and catch herself and she ended up falling onto the ground onto her outstretched right arm.  Patient tried taking Voltaren gel and over-the-counter medications but the pain persisted.  She is having pain in her right upper arm and shoulder.  She has difficulty moving her shoulder because of the pain.  Patient denies any loss of consciousness.  She denies any other injuries other than her right arm.  No numbness or weakness. Past Medical History:  Diagnosis Date  . Arthritis   . Bipolar 1 disorder (HCC)   . COPD (chronic obstructive pulmonary disease) (HCC)   . Diabetes mellitus without complication (HCC)   . GERD (gastroesophageal reflux disease)   . Hypertension     There are no active problems to display for this patient.   Past Surgical History:  Procedure Laterality Date  . ABDOMINAL HYSTERECTOMY    . CHOLECYSTECTOMY    . ELBOW SURGERY    . KNEE ARTHROSCOPY    . TUBAL LIGATION    . ulnar nerve entrapment        OB History   No obstetric history on file.      Home Medications    Prior to Admission medications   Medication Sig Start Date End Date Taking? Authorizing Provider  loratadine (CLARITIN) 10 MG tablet Take by mouth. 12/27/17  Yes [provider]  meloxicam (MOBIC) 15 MG tablet Take by mouth. 06/23/17  Yes [provider]  albuterol (PROVENTIL HFA;VENTOLIN HFA) 108 (90 Base) MCG/ACT  inhaler Inhale 1-2 puffs into the lungs every 6 (six) hours as needed for wheezing or shortness of breath. 06/06/17   Rise MuLeaphart, Kenneth T, PA-C  aspirin EC 81 MG tablet Take 81 mg by mouth daily.    [provider]  diclofenac sodium (VOLTAREN) 1 % GEL Apply topically 4 (four) times daily.    [provider]  ENALAPRIL MALEATE PO Take by mouth.    [provider]  fluticasone-salmeterol (ADVAIR HFA) 230-21 MCG/ACT inhaler Inhale 2 puffs into the lungs 2 (two) times daily.    [provider]  gabapentin (NEURONTIN) 300 MG capsule Take 300 mg by mouth 3 (three) times daily.    [provider]  hydrochlorothiazide (MICROZIDE) 12.5 MG capsule Take 12.5 mg by mouth daily.    [provider]  lamoTRIgine (LAMICTAL) 100 MG tablet Take 100 mg by mouth daily.    [provider]  montelukast (SINGULAIR) 10 MG tablet Take 10 mg by mouth daily before supper.    [provider]  naproxen (NAPROSYN) 500 MG tablet Take 1 tablet (500 mg total) by mouth 2 (two) times daily with a meal. 11/04/17   Caccavale, Sophia, PA-C  omeprazole (PRILOSEC) 20 MG capsule Take 20 mg by mouth daily.    [provider]  potassium chloride (KLOR-CON) 10 MEQ  tablet Take 10 mEq by mouth daily. 10/17/18   [provider]  sertraline (ZOLOFT) 25 MG tablet Take 25 mg by mouth daily.    [provider]  torsemide (DEMADEX) 10 MG tablet Take by mouth.    [provider]    Family History Family History  Problem Relation Age of Onset  . Cancer Father   . Cancer Sister   . Cancer Brother   . Cancer Maternal Grandmother   . Cancer Paternal Grandmother     Social History Social History   Tobacco Use  . Smoking status: Current Every Day Smoker    Packs/day: 1.00    Types: Cigarettes  . Smokeless tobacco: Never Used  Substance Use Topics  . Alcohol use: No  . Drug use: No     Allergies   Patient has no known allergies.    Review of Systems Review of Systems  All other systems reviewed and are negative.    Physical Exam Updated Vital Signs BP 138/87 (BP Location: Left Arm)   Pulse 81   Temp 98.7 F (37.1 C) (Oral)   Resp 20   Ht 1.6 m (5\' 3" )   Wt 108.9 kg   SpO2 98%   BMI 42.51 kg/m   Physical Exam Vitals signs and nursing note reviewed.  Constitutional:      General: She is not in acute distress.    Appearance: She is well-developed.  HENT:     Head: Normocephalic and atraumatic.     Right Ear: External ear normal.     Left Ear: External ear normal.  Eyes:     General: No scleral icterus.       Right eye: No discharge.        Left eye: No discharge.     Conjunctiva/sclera: Conjunctivae normal.  Neck:     Musculoskeletal: Neck supple.     Trachea: No tracheal deviation.  Cardiovascular:     Rate and Rhythm: Normal rate.  Pulmonary:     Effort: Pulmonary effort is normal. No respiratory distress.     Breath sounds: No stridor.  Abdominal:     General: There is no distension.  Musculoskeletal:        General: No swelling or deformity.     Right shoulder: She exhibits decreased range of motion and tenderness. She exhibits no swelling.     Right elbow: Normal.She exhibits no swelling.     Right wrist: Normal.     Right upper arm: She exhibits tenderness and bony tenderness. She exhibits no swelling.  Skin:    General: Skin is warm and dry.     Findings: No rash.  Neurological:     Mental Status: She is alert.     Cranial Nerves: Cranial nerve deficit: no gross deficits.      ED Treatments / Results  Labs (all labs ordered are listed, but only abnormal results are displayed) Labs Reviewed - No data to display  EKG None  Radiology Dg Shoulder Right  Result Date: 03/19/2019 CLINICAL DATA:  Fall today. Right shoulder pain and decreased range of motion. EXAM: RIGHT SHOULDER - 2+ VIEW COMPARISON:  None. FINDINGS: There is no evidence of fracture or dislocation. Mild  degenerative spurring is seen involving the acromioclavicular joint. No other osseous abnormality identified. Soft tissues are unremarkable. IMPRESSION: No acute findings. Mild acromioclavicular degenerative spurring. Electronically Signed   By: Marlaine Hind M.D.   On: 03/19/2019 09:26   Dg Humerus Right  Result  Date: 03/19/2019 CLINICAL DATA:  Fall this morning. Right upper arm pain. Initial encounter. EXAM: RIGHT HUMERUS - 2+ VIEW COMPARISON:  None. FINDINGS: There is no evidence of fracture or other focal bone lesions. Soft tissues are unremarkable. IMPRESSION: Negative. Electronically Signed   By: Danae Orleans M.D.   On: 03/19/2019 09:27    Procedures Procedures (including critical care time)  Medications Ordered in ED Medications  HYDROcodone-acetaminophen (NORCO/VICODIN) 5-325 MG per tablet 1 tablet (1 tablet Oral Given 03/19/19 2952)     Initial Impression / Assessment and Plan / ED Course  I have reviewed the triage vital signs and the nursing notes.  Pertinent labs & imaging results that were available during my care of the patient were reviewed by me and considered in my medical decision making (see chart for details).   X-rays without signs of fracture or dislocation.  Symptoms most likely consistent with a sprain.  Dc with sling for comfort.  OTC pain meds as needed.  Follow up with pcp, or sports medicine if not improving  Final Clinical Impressions(s) / ED Diagnoses   Final diagnoses:  Sprain of right shoulder, unspecified shoulder sprain type, initial encounter    ED Discharge Orders    None       Linwood Dibbles, MD 03/19/19 [phone]

## 2020-09-01 IMAGING — CR DG SHOULDER 2+V*R*
2 series · 2 of 2 positions shown · non-contrast
Comparison: None.

CLINICAL DATA: Fall today. Right shoulder pain and decreased range
of motion.

EXAM:
RIGHT SHOULDER - 2+ VIEW

[w shoulder grashey right]
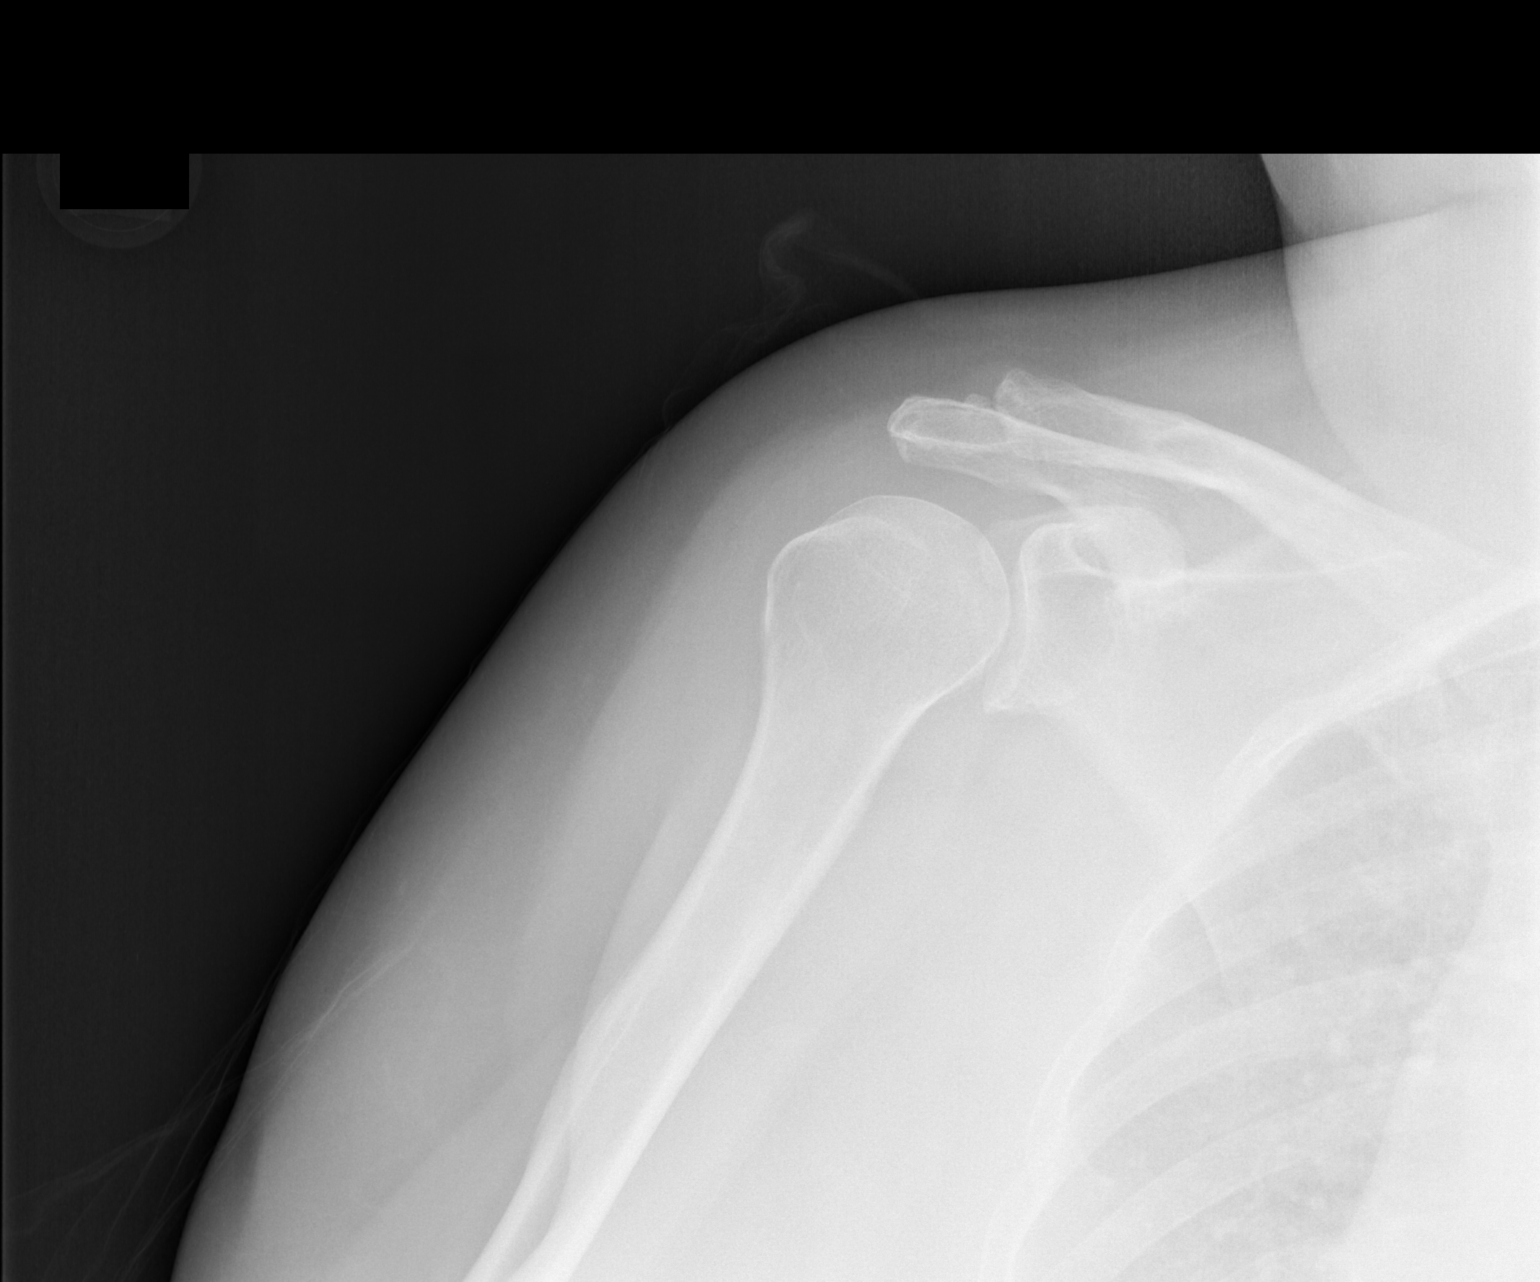

[w shoulder y view right]
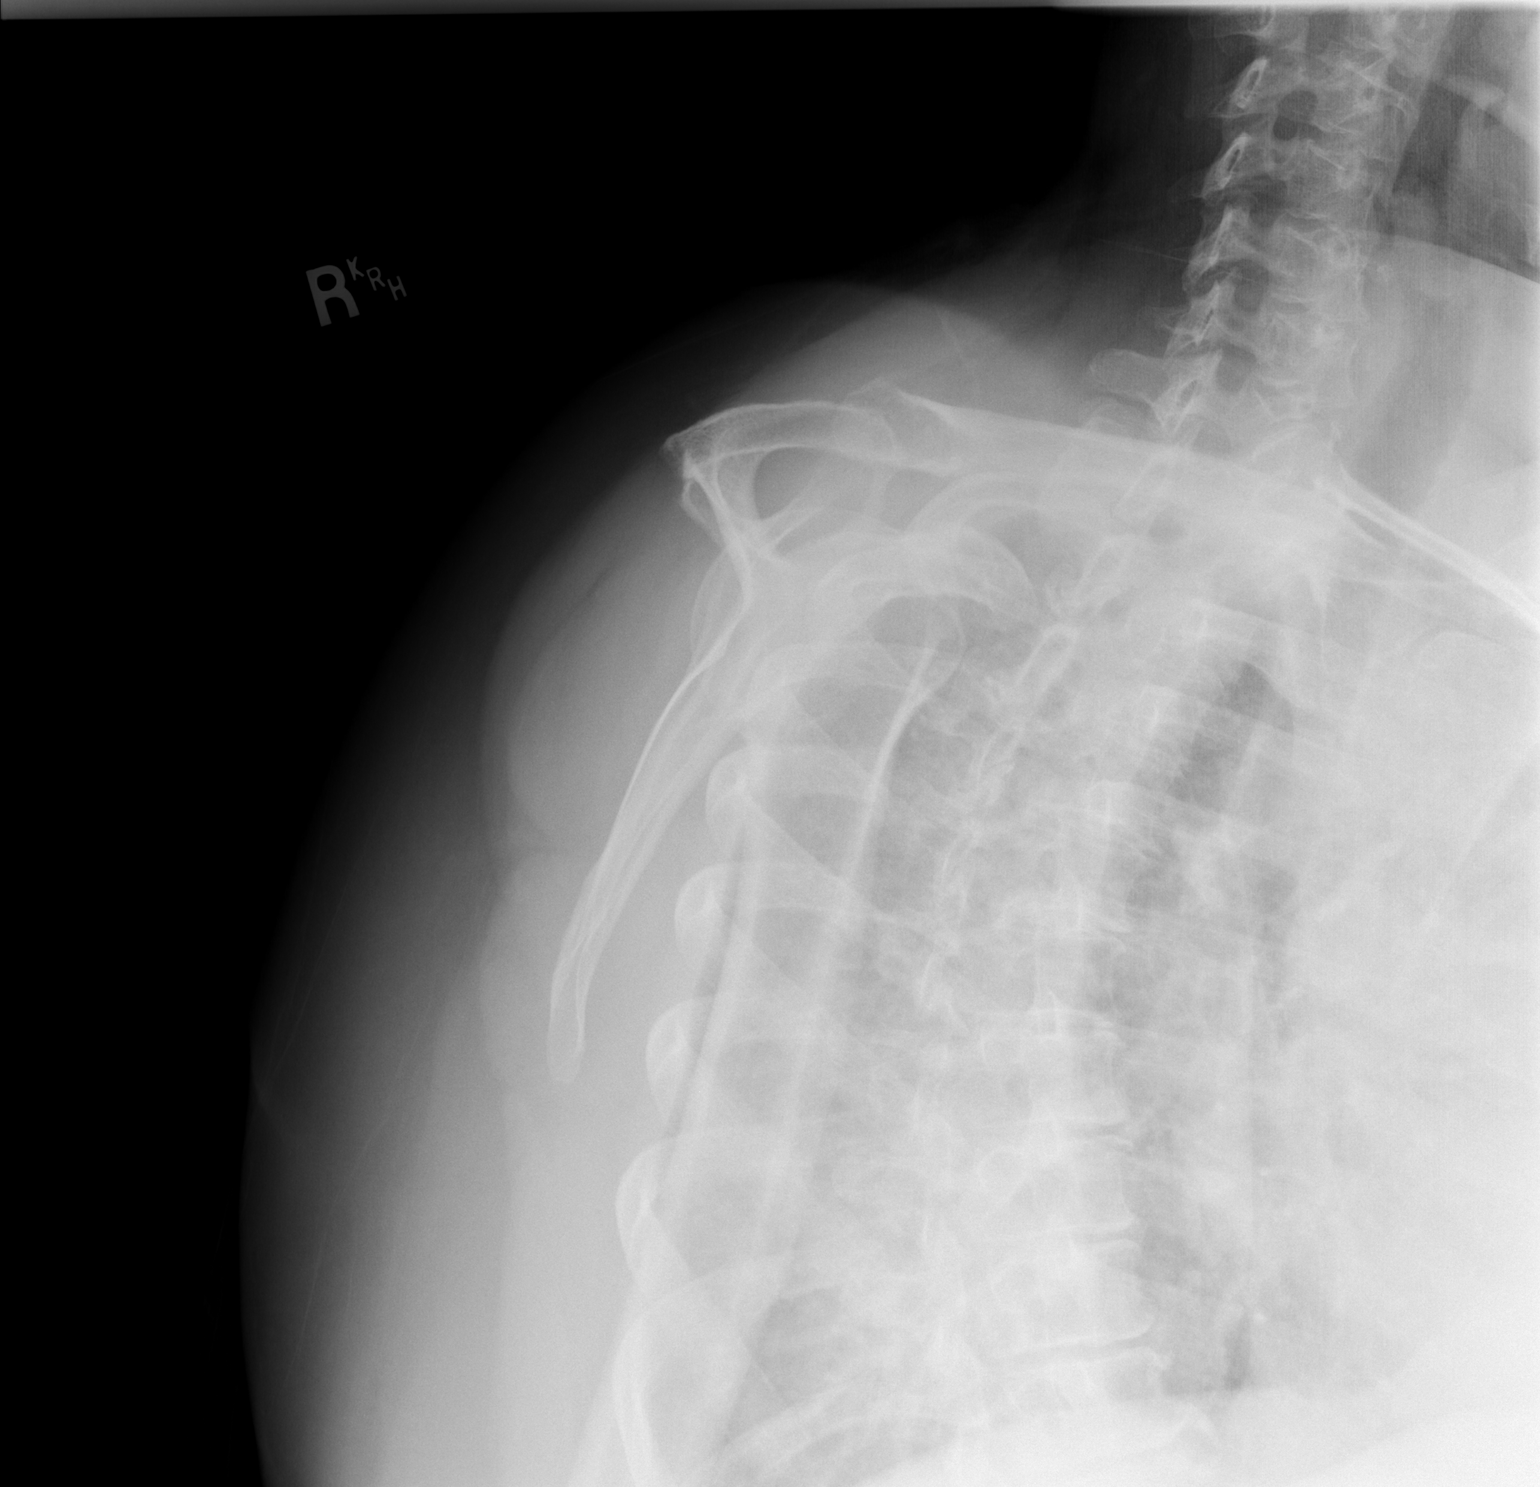

[2 of 2 positions shown; findings below may reference images not displayed]

FINDINGS: There is no evidence of fracture or dislocation. Mild degenerative
spurring is seen involving the acromioclavicular joint. No other
osseous abnormality identified. Soft tissues are unremarkable.
IMPRESSION: No acute findings. Mild acromioclavicular degenerative spurring.
